# Patient Record
Sex: Male | Born: 1990 | Race: White | Hispanic: No | Marital: Single | Smoking: Current every day smoker
Health system: Southern US, Community
[De-identification: ages and names within clinical notes are randomized; demographics above are authoritative.]

---
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
## 2008-09-09 ENCOUNTER — Emergency Department (HOSPITAL_COMMUNITY): Admission: EM | Admit: 2008-09-09 | Discharge: 2008-09-10 | Payer: Self-pay | Admitting: Emergency Medicine

## 2008-09-27 ENCOUNTER — Emergency Department (HOSPITAL_COMMUNITY): Admission: EM | Admit: 2008-09-27 | Discharge: 2008-09-27 | Payer: Self-pay | Admitting: Emergency Medicine

## 2008-11-11 ENCOUNTER — Emergency Department (HOSPITAL_COMMUNITY): Admission: EM | Admit: 2008-11-11 | Discharge: 2008-11-11 | Payer: Self-pay | Admitting: Emergency Medicine

## 2009-08-24 IMAGING — CR DG ANKLE COMPLETE 3+V*R*
4 series · 4 of 4 positions shown · non-contrast
Comparison: None.

CLINICAL DATA: Right ankle pain.

RIGHT ANKLE - 3 VIEW  03/03/2007:

[view not recorded (1 of 4)]
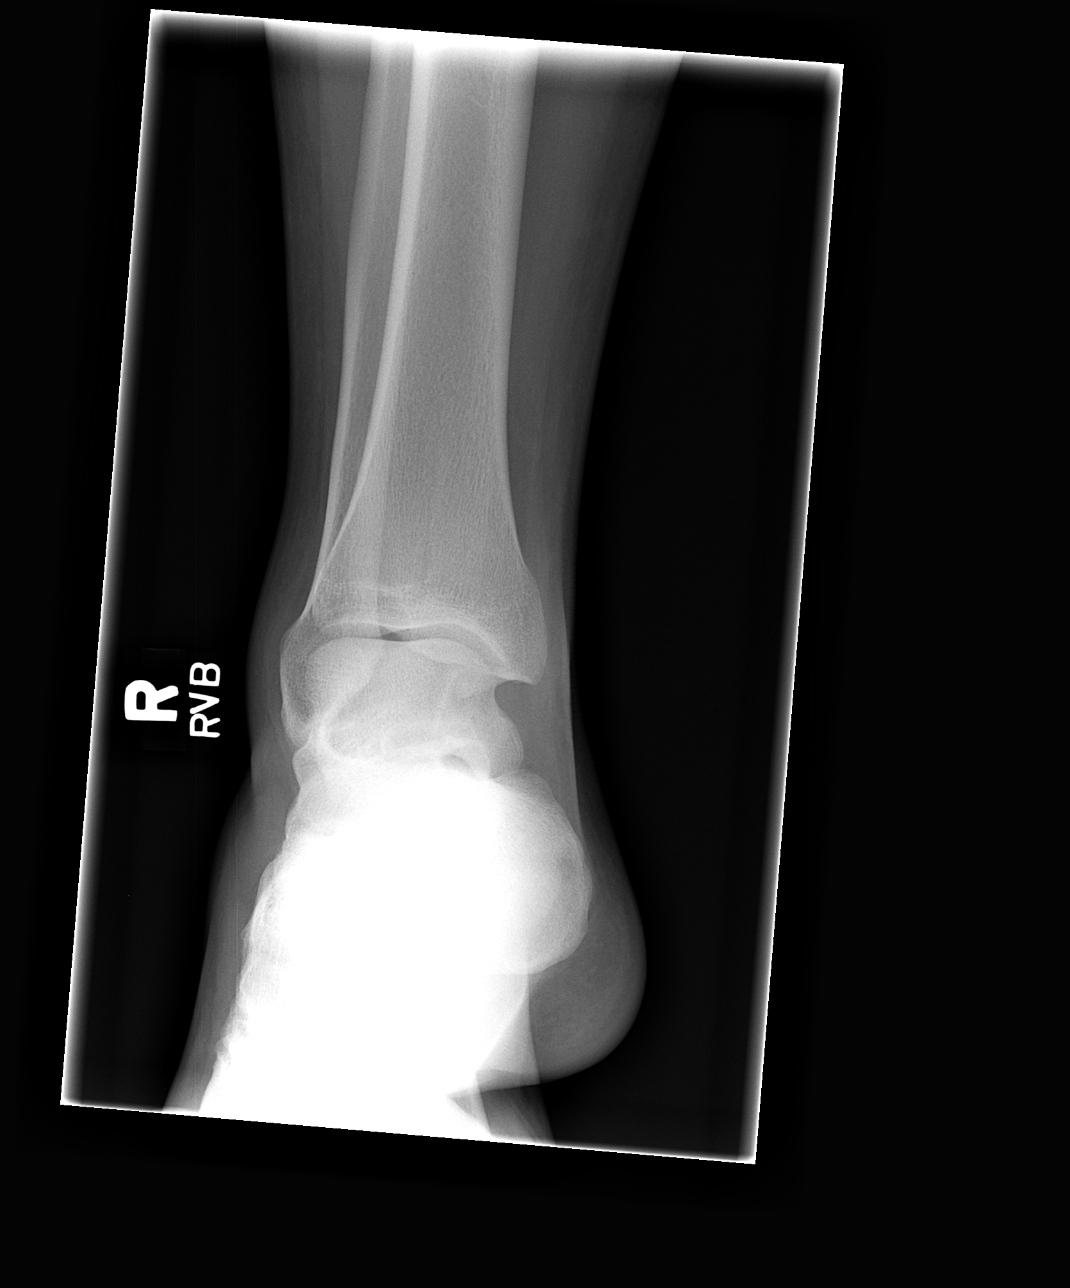

[view not recorded (2 of 4)]
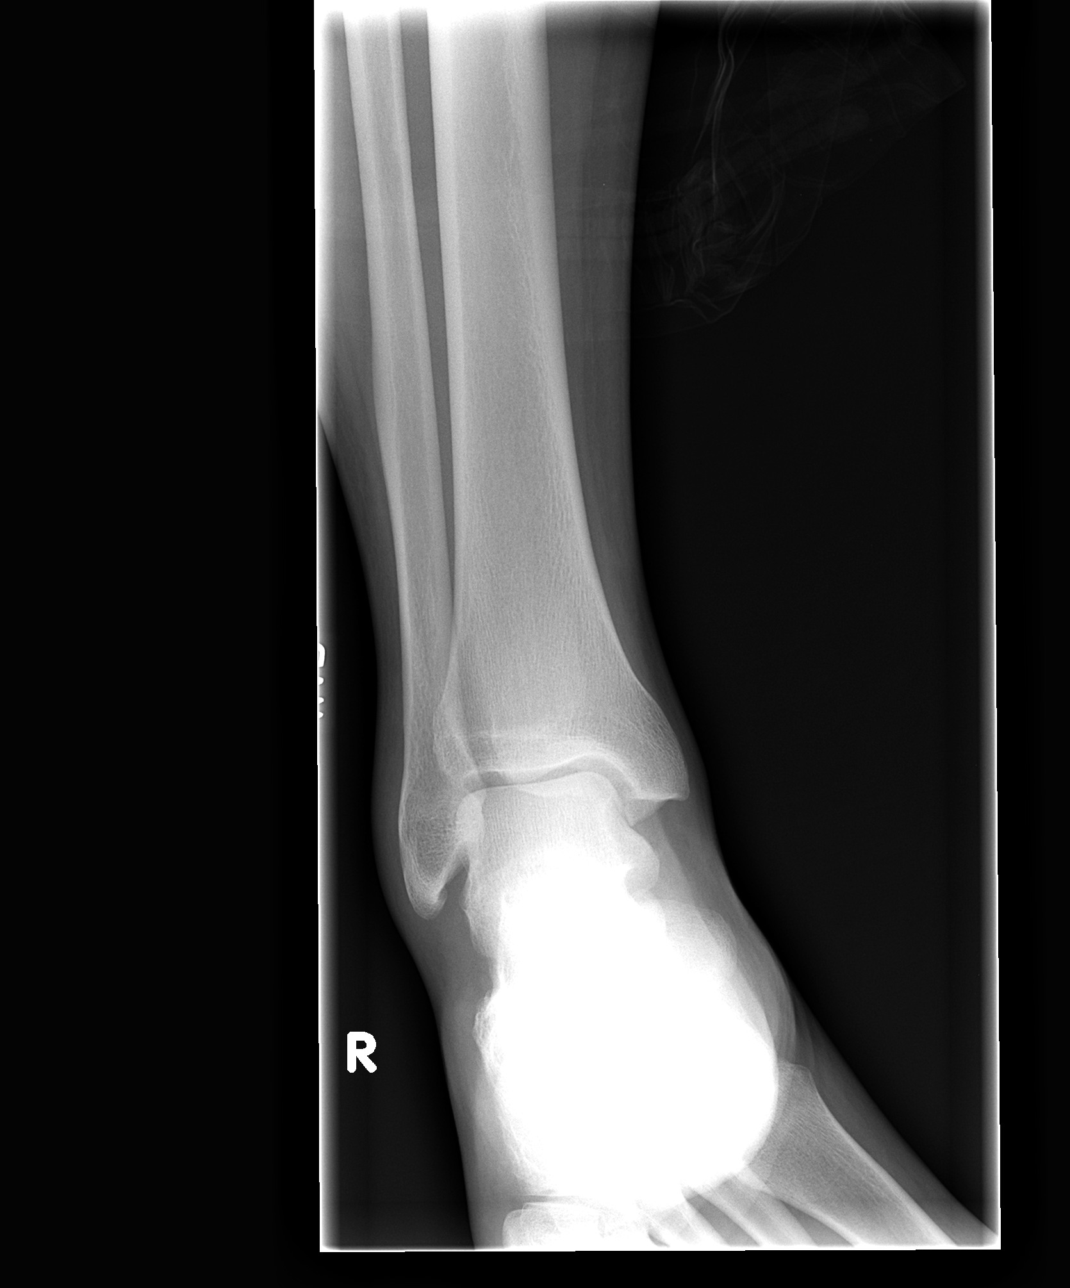

[view not recorded (3 of 4)]
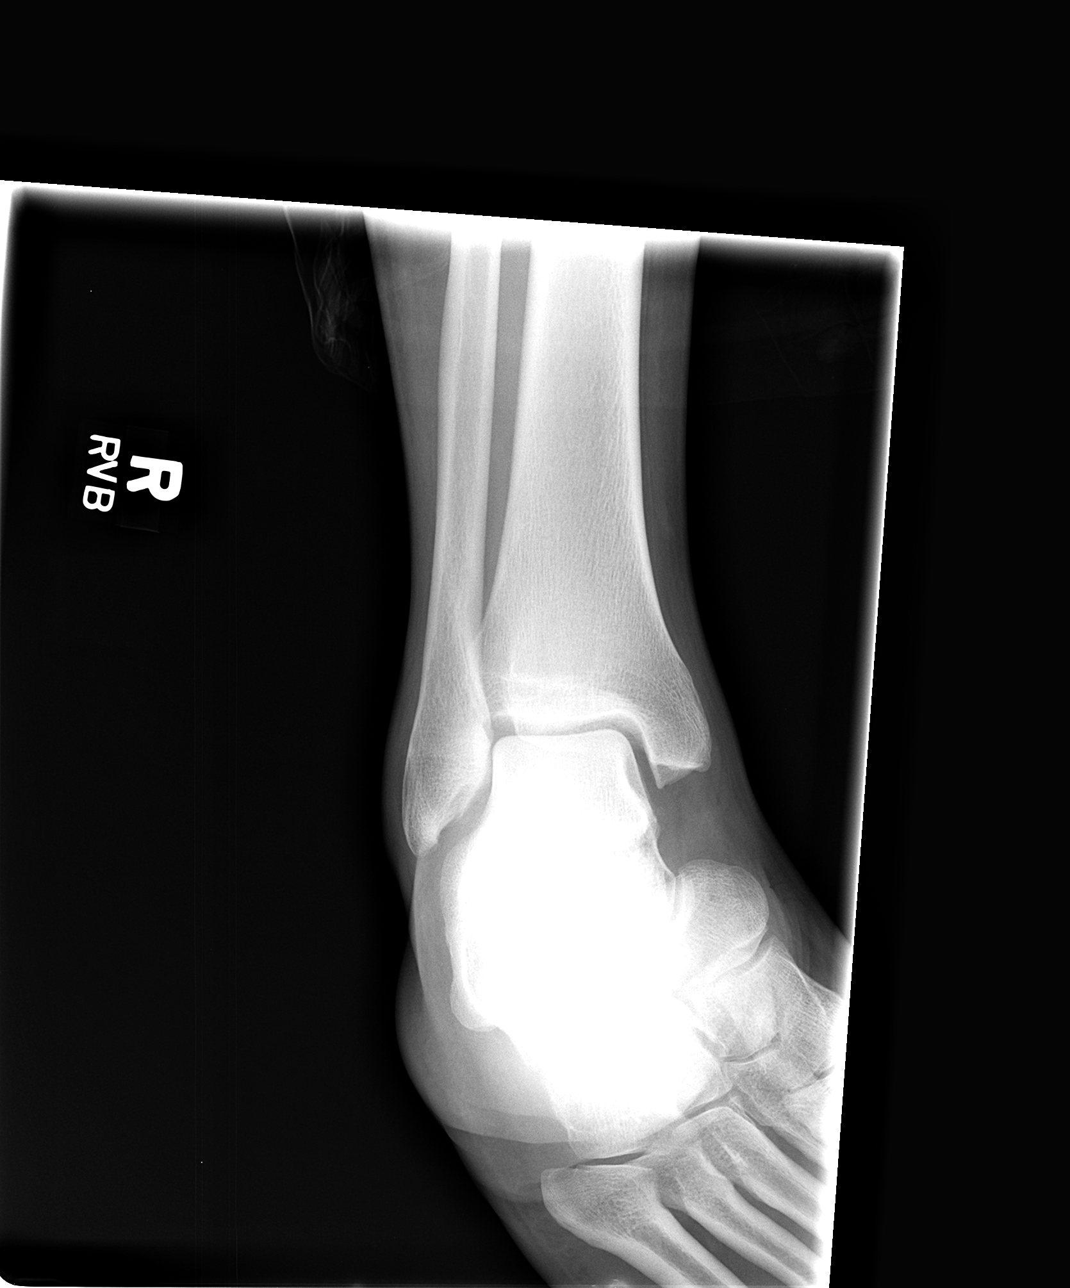

[view not recorded (4 of 4)]
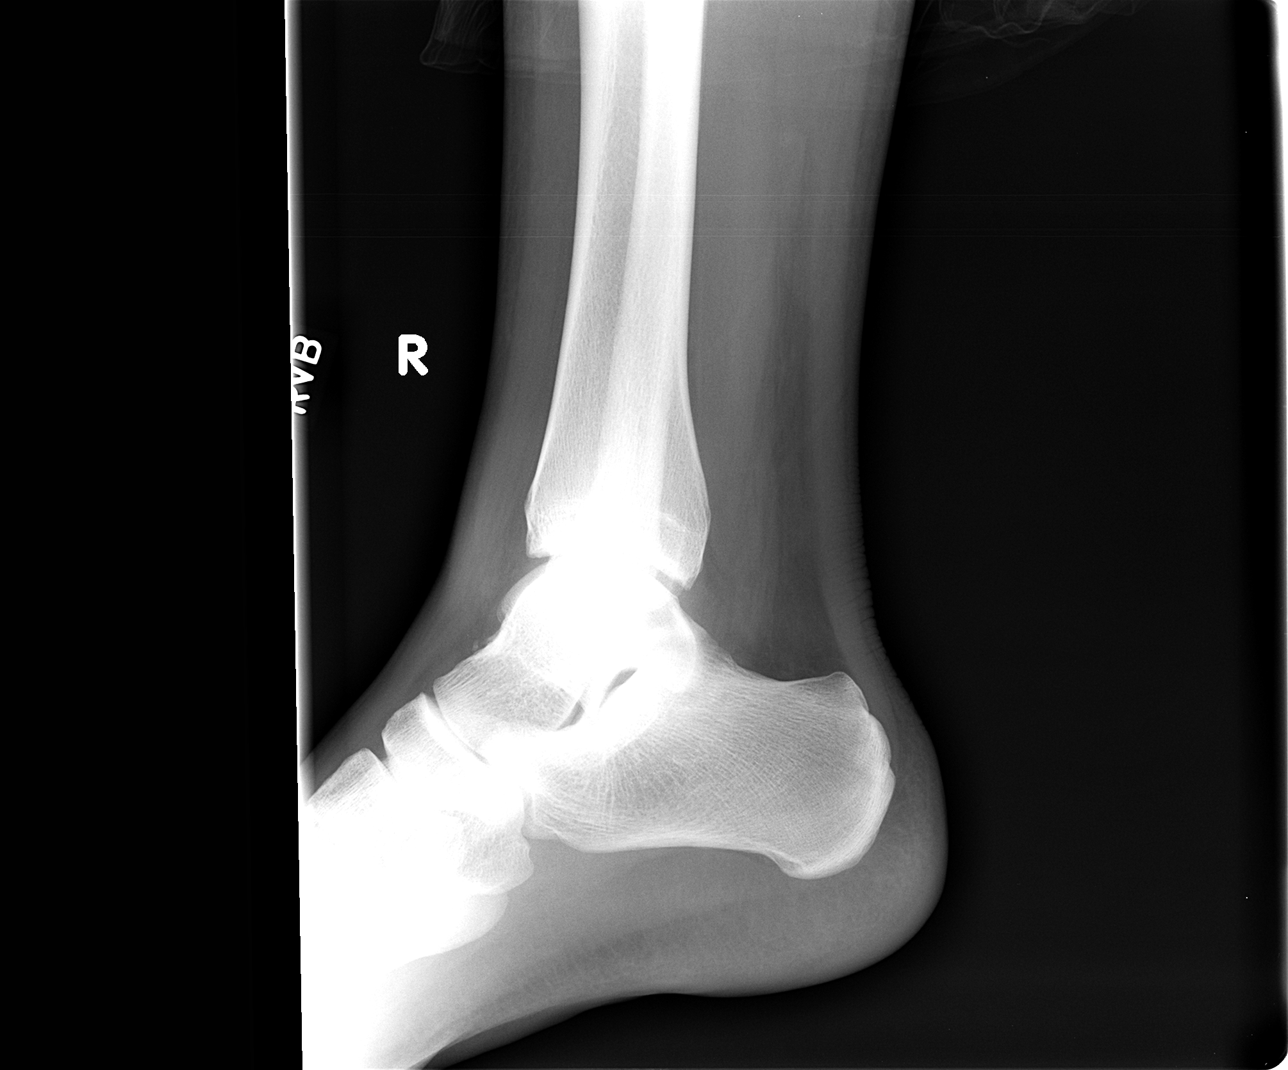

[4 of 4 positions shown; findings below may reference images not displayed]

FINDINGS: No evidence of acute or subacute fracture or dislocation. Ankle
mortise intact. No evidence of significant joint effusion. Irregularity along
the dorsal aspect of the talus without discrete fracture.
IMPRESSION: No acute or significant skeletal abnormalities.

## 2013-09-20 ENCOUNTER — Emergency Department: Payer: Self-pay | Admitting: Emergency Medicine

## 2013-09-21 ENCOUNTER — Emergency Department: Payer: Self-pay | Admitting: Emergency Medicine

## 2013-09-22 ENCOUNTER — Emergency Department: Payer: Self-pay | Admitting: Emergency Medicine

## 2013-09-24 ENCOUNTER — Emergency Department: Payer: Self-pay | Admitting: Emergency Medicine

## 2013-09-27 ENCOUNTER — Emergency Department: Payer: Self-pay | Admitting: Student

## 2020-07-18 ENCOUNTER — Emergency Department: Payer: Self-pay

## 2020-07-18 ENCOUNTER — Other Ambulatory Visit: Payer: Self-pay

## 2020-07-18 ENCOUNTER — Encounter: Payer: Self-pay | Admitting: Emergency Medicine

## 2020-07-18 ENCOUNTER — Emergency Department
Admission: EM | Admit: 2020-07-18 | Discharge: 2020-07-18 | Disposition: A | Discharge status: Home / Self Care | Payer: Self-pay | Attending: Emergency Medicine | Admitting: Emergency Medicine

## 2020-07-18 DIAGNOSIS — L089 Local infection of the skin and subcutaneous tissue, unspecified: Secondary | ICD-10-CM | POA: Insufficient documentation

## 2020-07-18 DIAGNOSIS — X58XXXA Exposure to other specified factors, initial encounter: Secondary | ICD-10-CM | POA: Insufficient documentation

## 2020-07-18 DIAGNOSIS — F17210 Nicotine dependence, cigarettes, uncomplicated: Secondary | ICD-10-CM | POA: Insufficient documentation

## 2020-07-18 DIAGNOSIS — S90412A Abrasion, left great toe, initial encounter: Secondary | ICD-10-CM | POA: Insufficient documentation

## 2020-07-18 MED ORDER — CEPHALEXIN 500 MG PO CAPS: 500.0000 mg | ORAL_CAPSULE | Freq: Three times a day (TID) | ORAL | 0 refills | Status: AC

## 2020-07-18 NOTE — ED Notes (Signed)
Patient reports working around sewage recently, and reports some sewage water got into his boot. Patient reports swelling and pain to the left great toe since last week. Patient reports taking expired Amoxicillin last week and reports some improvement to swelling since taking it. Patient ambulatory with steady gait.

## 2020-07-18 NOTE — ED Triage Notes (Signed)
Pt to ED via POV c/o left great toe pain x 1 week. Pt states that last week there was a pus pocket on it but it has busted. Pt is in NAD.

## 2020-07-18 NOTE — ED Provider Notes (Signed)
[facility] Emergency Department Provider Note  ____________________________________________   Event Date/Time   First MD Initiated Contact with Patient 07/18/20 1349     (approximate)  I have reviewed the triage vital signs and the nursing notes.   HISTORY  Chief Complaint Toe Pain    HPI Jason Moody is a 30 y.o. male presents emergency department with left great toe pain and swelling.  Patient states that he had a pus pocket that burst in the shower the other day.  States he was working on a sewer drain with remote boots on and the water came over into the boot.  He is afraid this is what caused the infection.  He denies fever or chills.  History reviewed. No pertinent past medical history.  There are no problems to display for this patient.   Past Surgical History:  Procedure Laterality Date   FRACTURE SURGERY      Prior to Admission medications   Medication Sig Start Date End Date Taking? Authorizing Provider  cephALEXin (KEFLEX) 500 MG capsule Take 1 capsule (500 mg total) by mouth 3 (three) times daily. 07/18/20  Yes Faythe Ghee, PA-C    Allergies Patient has no known allergies.  History reviewed. No pertinent family history.  Social History Social History   Tobacco Use   Smoking status: Every Day    Packs/day: 1.00    Pack years: 0.00    Types: Cigarettes   Smokeless tobacco: Never  Substance Use Topics   Alcohol use: Not Currently    Review of Systems  Constitutional: No fever/chills Eyes: No visual changes. ENT: No sore throat. Respiratory: Denies cough Cardiovascular: Denies chest pain Gastrointestinal: Denies abdominal pain Genitourinary: Negative for dysuria. Musculoskeletal: Negative for back pain.  Positive for left great toe pain Skin: Negative for rash. Psychiatric: no mood changes,     ____________________________________________   PHYSICAL EXAM:  VITAL SIGNS: ED Triage Vitals  Enc Vitals  Group     BP 07/18/20 1210 112/67     Pulse Rate 07/18/20 1210 (!) 59     Resp 07/18/20 1210 16     Temp 07/18/20 1210 97.6 F (36.4 C)     Temp Source 07/18/20 1210 Oral     SpO2 07/18/20 1210 98 %     Weight 07/18/20 1204 225 lb (102.1 kg)     Height 07/18/20 1204 6\' 2"  (1.88 m)     Head Circumference --      Peak Flow --      Pain Score 07/18/20 1204 3     Pain Loc --      Pain Edu? --      Excl. in GC? --     Constitutional: Alert and oriented. Well appearing and in no acute distress. Eyes: Conjunctivae are normal.  Head: Atraumatic. Nose: No congestion/rhinnorhea. Mouth/Throat: Mucous membranes are moist.   Neck:  supple no lymphadenopathy noted Cardiovascular: Normal rate, regular rhythm.  Respiratory: Normal respiratory effort.  No retractions GU: deferred Musculoskeletal: FROM all extremities, warm and well perfused, left great toe is red/swollen at nail bed areas, no drainage noted Neurologic:  Normal speech and language.  Skin:  Skin is warm, dry and intact. No rash noted. Psychiatric: Mood and affect are normal. Speech and behavior are normal.  ____________________________________________   LABS (all labs ordered are listed, but only abnormal results are displayed)  Labs Reviewed - No data to display ____________________________________________   ____________________________________________  RADIOLOGY  Xray left great toe  ____________________________________________   PROCEDURES  Procedure(s) performed: No  Procedures    ____________________________________________   INITIAL IMPRESSION / ASSESSMENT AND PLAN / ED COURSE  Pertinent labs & imaging results that were available during my care of the patient were reviewed by me and considered in my medical decision making (see chart for details).   Patient is a 30 year old male presents with left great toe pain and questionable infection.  See HPI.  Physical exam shows patient per stable  X-ray  left great toe Patient states his Tdap is up-to-date  X-ray of the left great toe reviewed by me confirmed by radiology to have no osteomyelitis.  Did explain the findings to the patient.  He was given a prescription for an antibiotic.  He was instructed to soak the foot in warm water with Epson salts.  Follow-up with podiatry.  He was discharged in stable condition.    Jason Moody was evaluated in Emergency Department on 07/18/2020 for the symptoms described in the history of present illness. He was evaluated in the context of the global COVID-19 pandemic, which necessitated consideration that the patient might be at risk for infection with the SARS-CoV-2 virus that causes COVID-19. Institutional protocols and algorithms that pertain to the evaluation of patients at risk for COVID-19 are in a state of rapid change based on information released by regulatory bodies including the CDC and federal and state organizations. These policies and algorithms were followed during the patient's care in the ED.    As part of my medical decision making, I reviewed the following data within the electronic MEDICAL RECORD NUMBER Nursing notes reviewed and incorporated, Old chart reviewed, Radiograph reviewed , Notes from prior ED visits, and [place] Controlled Substance Database  ____________________________________________   FINAL CLINICAL IMPRESSION(S) / ED DIAGNOSES  Final diagnoses:  Infected abrasion of great toe of left foot, initial encounter      NEW MEDICATIONS STARTED DURING THIS VISIT:  Discharge Medication List as of 07/18/2020  4:00 PM     START taking these medications   Details  cephALEXin (KEFLEX) 500 MG capsule Take 1 capsule (500 mg total) by mouth 3 (three) times daily., Starting Sat 07/18/2020, Print         Note:  This document was prepared using Dragon voice recognition software and may include unintentional dictation errors.    Faythe Ghee, PA-C 07/18/20 Orland Penman, MD 07/19/20 1310

## 2020-07-18 NOTE — Discharge Instructions (Addendum)
Soak the toe in warm water with Epson salts.  Take the antibiotic as prescribed.  Follow-up with podiatry if not improving to 3 days.
